# Patient Record
Sex: Male | Born: 1985 | Race: White | Hispanic: No | Marital: Married | Smoking: Never smoker
Health system: Southern US, Community
[De-identification: ages and names within clinical notes are randomized; demographics above are authoritative.]

## PROBLEM LIST (undated history)

## (undated) DIAGNOSIS — C801 Malignant (primary) neoplasm, unspecified: Secondary | ICD-10-CM

---

## 2016-02-03 DIAGNOSIS — C801 Malignant (primary) neoplasm, unspecified: Secondary | ICD-10-CM

## 2016-02-03 HISTORY — DX: Malignant (primary) neoplasm, unspecified: C80.1

## 2017-02-02 ENCOUNTER — Other Ambulatory Visit (HOSPITAL_COMMUNITY): Payer: Self-pay | Admitting: Oculoplastics Ophthalmology

## 2017-02-02 DIAGNOSIS — H469 Unspecified optic neuritis: Secondary | ICD-10-CM

## 2017-02-06 ENCOUNTER — Ambulatory Visit (HOSPITAL_COMMUNITY)
Admission: RE | Admit: 2017-02-06 | Discharge: 2017-02-06 | Disposition: A | Discharge status: Home / Self Care | Payer: BLUE CROSS/BLUE SHIELD | Source: Ambulatory Visit | Attending: Oculoplastics Ophthalmology | Admitting: Oculoplastics Ophthalmology

## 2017-02-06 DIAGNOSIS — H469 Unspecified optic neuritis: Secondary | ICD-10-CM | POA: Diagnosis not present

## 2017-02-06 MED ORDER — GADOBENATE DIMEGLUMINE 529 MG/ML IV SOLN
20.0000 mL | Freq: Once | INTRAVENOUS | Status: AC | PRN
  Administered 2017-02-06: 20 mL via INTRAVENOUS

## 2017-02-23 ENCOUNTER — Encounter (HOSPITAL_COMMUNITY): Payer: Self-pay

## 2017-02-23 ENCOUNTER — Emergency Department (HOSPITAL_COMMUNITY)
Admission: EM | Admit: 2017-02-23 | Discharge: 2017-02-23 | Disposition: A | Discharge status: Home / Self Care | Payer: BLUE CROSS/BLUE SHIELD | Attending: Emergency Medicine | Admitting: Emergency Medicine

## 2017-02-23 ENCOUNTER — Emergency Department (HOSPITAL_COMMUNITY): Payer: BLUE CROSS/BLUE SHIELD

## 2017-02-23 DIAGNOSIS — Z79899 Other long term (current) drug therapy: Secondary | ICD-10-CM | POA: Insufficient documentation

## 2017-02-23 DIAGNOSIS — H5789 Other specified disorders of eye and adnexa: Secondary | ICD-10-CM | POA: Diagnosis not present

## 2017-02-23 DIAGNOSIS — Z85828 Personal history of other malignant neoplasm of skin: Secondary | ICD-10-CM | POA: Insufficient documentation

## 2017-02-23 HISTORY — DX: Malignant (primary) neoplasm, unspecified: C80.1

## 2017-02-23 LAB — CBC WITH DIFFERENTIAL/PLATELET
Basophils Absolute: 0 10*3/uL (ref 0.0–0.1)
Basophils Relative: 0 %
EOS ABS: 0.1 10*3/uL (ref 0.0–0.7)
EOS PCT: 1 %
HCT: 47.7 % (ref 39.0–52.0)
Hemoglobin: 16.7 g/dL (ref 13.0–17.0)
LYMPHS ABS: 1.8 10*3/uL (ref 0.7–4.0)
LYMPHS PCT: 25 %
MCH: 31.9 pg (ref 26.0–34.0)
MCHC: 35 g/dL (ref 30.0–36.0)
MCV: 91 fL (ref 78.0–100.0)
MONO ABS: 0.7 10*3/uL (ref 0.1–1.0)
MONOS PCT: 9 %
Neutro Abs: 4.8 10*3/uL (ref 1.7–7.7)
Neutrophils Relative %: 65 %
PLATELETS: 268 10*3/uL (ref 150–400)
RBC: 5.24 MIL/uL (ref 4.22–5.81)
RDW: 13 % (ref 11.5–15.5)
WBC: 7.4 10*3/uL (ref 4.0–10.5)

## 2017-02-23 LAB — C-REACTIVE PROTEIN: CRP: <0.8 mg/dL (ref <1.0)

## 2017-02-23 LAB — I-STAT CHEM 8, ED
BUN: 18 mg/dL (ref 6–20)
CALCIUM ION: 1.22 mmol/L (ref 1.15–1.40)
CHLORIDE: 100 mmol/L — AB (ref 101–111)
Creatinine, Ser: 1 mg/dL (ref 0.61–1.24)
GLUCOSE: 99 mg/dL (ref 65–99)
HCT: 51 % (ref 39.0–52.0)
Hemoglobin: 17.3 g/dL — ABNORMAL HIGH (ref 13.0–17.0)
Potassium: 4.1 mmol/L (ref 3.5–5.1)
SODIUM: 139 mmol/L (ref 135–145)
TCO2: 28 mmol/L (ref 22–32)

## 2017-02-23 LAB — SEDIMENTATION RATE: Sed Rate: 0 mm/hr (ref 0–16)

## 2017-02-23 LAB — TSH: TSH: 1.403 u[IU]/mL (ref 0.350–4.500)

## 2017-02-23 MED ORDER — IOPAMIDOL (ISOVUE-300) INJECTION 61%
INTRAVENOUS | Status: AC
  Administered 2017-02-23: 75 mL
  Filled 2017-02-23: qty 75

## 2017-02-23 MED ORDER — PREDNISONE 20 MG PO TABS: 60.0000 mg | ORAL_TABLET | Freq: Every day | ORAL | 0 refills | Status: AC

## 2017-02-23 NOTE — Discharge Instructions (Signed)
It was my pleasure taking care of you today!   Please keep your appointment with Dr. Manuella Ghazi next week.   Take prednisone daily as directed.   Return to ER for new or worsening symptoms, any additional concerns.

## 2017-02-23 NOTE — ED Triage Notes (Signed)
Pt sent here by Dr. Manuella Ghazi, opthamology, for CT scan of eye. He woke up this morning with eye swollen shut. He has had eye problem in the left eye X2 weeks. Pt states he has double vision that began today.

## 2017-02-23 NOTE — ED Provider Notes (Signed)
[facility] EMERGENCY DEPARTMENT Provider Note   CSN: [id number] Arrival date & time: 02/23/17  1017     History   Chief Complaint Chief Complaint  Patient presents with  . Eye Problem    HPI Preston Grant is a 31 y.o. male.  The history is provided by the patient and medical records. No language interpreter was used.  Eye Problem   Pertinent negatives include no discharge, no photophobia and no eye redness.   Preston Grant is a 31 y.o. male  who presents to the Emergency Department complaining of swelling around his left eye since this morning. Patient states when he awoke this morning, he felt as if his eye was swollen shut. He endorses double vision when he looks right or left, but no double vision if not moving the eyes. He states that he had similar symptoms 2-3 weeks ago but at that time also had a dull pain behind his left eye. He was seen by an optometrist and ophthalmologist who did not know what the cause of his symptoms were. He had an MRI performed a few days later, at the time, symptoms had completely resolved. He went to see the ophthalmologist today who recommended that he come to the emergency department for further workup given he is symptomatic today. Ophthalmology, Dr. Manuella Ghazi, recommending CT orbits with contrast to assess for cellulitis. Per patient, he was also told his thyroid could cause this and to get thyroid checked. IOP was checked at the ophthalmologist today right eye 13, left 16. No fever, chills, cough, congestion, drainage from the eye, eye redness.   Past Medical History:  Diagnosis Date  . Cancer ([place]) 02/03/2016   Skin cancer melanoma    There are no active problems to display for this patient.   History reviewed. No pertinent surgical history.     Home Medications    Prior to Admission medications   Medication Sig Start Date End Date Taking? Authorizing Provider  loratadine (CLARITIN) 10 MG tablet Take 10 mg by  mouth daily.   Yes [provider]  ranitidine (ZANTAC) 150 MG tablet Take 150 mg by mouth daily.   Yes [provider]  predniSONE (DELTASONE) 20 MG tablet Take 3 tablets (60 mg total) by mouth daily. 02/23/17   Ward, Ozella Almond, PA-C    Family History History reviewed. No pertinent family history.  Social History Social History  Substance Use Topics  . Smoking status: Never Smoker  . Smokeless tobacco: Never Used  . Alcohol use 0.6 oz/week    1 Cans of beer per week     Allergies   Patient has no known allergies.   Review of Systems Review of Systems  Constitutional: Negative for chills and fever.  Eyes: Positive for visual disturbance (Blurry vision). Negative for photophobia, discharge and redness.       + swelling around left eye  All other systems reviewed and are negative.    Physical Exam Updated Vital Signs BP (!) 149/102 (BP Location: Right Arm)   Pulse 72   Temp 98.1 F (36.7 C) (Oral)   Resp 16   SpO2 99%   Physical Exam  Constitutional: He is oriented to person, place, and time. He appears well-developed and well-nourished. No distress.  HENT:  Head: Normocephalic and atraumatic.  Mouth/Throat: Oropharynx is clear and moist.  Eyes: Pupils are equal, round, and reactive to light. EOM are normal.  Mild periorbital swelling. No warmth. No tenderness to palpation. No consensual photophobia.  Cardiovascular: Normal rate, regular rhythm and normal heart sounds.   No murmur heard. Pulmonary/Chest: Effort normal and breath sounds normal. No respiratory distress.  Abdominal: Soft. He exhibits no distension. There is no tenderness.  Musculoskeletal: Normal range of motion.  Neurological: He is alert and oriented to person, place, and time.  Skin: Skin is warm and dry.  Nursing note and vitals reviewed.    ED Treatments / Results  Labs (all labs ordered are listed, but only abnormal results are displayed) Labs Reviewed  I-STAT CHEM  8, ED - Abnormal; Notable for the following:       Result Value   Chloride 100 (*)    Hemoglobin 17.3 (*)    All other components within normal limits  CBC WITH DIFFERENTIAL/PLATELET  TSH  C-REACTIVE PROTEIN  SEDIMENTATION RATE  ACETYLCHOLINE RECEPTOR AB, ALL  THYROID STIMULATING IMMUNOGLOBULIN  QUANTIFERON-TB GOLD PLUS  B. BURGDORFI ANTIBODIES  THYROTROPIN RECEPTOR AUTOABS  ANTINUCLEAR ANTIBODIES, IFA  ANGIOTENSIN CONVERTING ENZYME  RHEUMATOID FACTOR    EKG  EKG Interpretation None       Radiology Ct Orbits W Contrast  Result Date: 02/23/2017 CLINICAL DATA:  Acute swelling of the left eye.  Double vision. EXAM: CT ORBITS WITH CONTRAST TECHNIQUE: Multidetector CT images was performed according to the standard protocol following intravenous contrast administration. CONTRAST:  75 cc Isovue-300 COMPARISON:  CT and MRI 02/06/2017 FINDINGS: Orbits: Both globes appear normal. Both optic nerves are normal. Extra-ocular muscles are normal. Lacrimal glands are normal. Orbital fat is normal. Orbital apices are normal. Vascular structures appear normal. No bone abnormality. Visualized sinuses: Clear and normal Soft tissues: Nonspecific mild periorbital superficial soft tissue edema left more than right. Limited intracranial: Normal IMPRESSION: Nonspecific mild periorbital soft tissue edema left more than right. No evidence of intrinsic orbital pathology. Electronically Signed   By: Nelson Chimes M.D.   On: 02/23/2017 12:56    Procedures Procedures (including critical care time)  Medications Ordered in ED Medications  iopamidol (ISOVUE-300) 61 % injection (75 mLs  Contrast Given 02/23/17 1202)     Initial Impression / Assessment and Plan / ED Course  I have reviewed the triage vital signs and the nursing notes.  Pertinent labs & imaging results that were available during my care of the patient were reviewed by me and considered in my medical decision making (see chart for  details).    Preston Grant is a 31 y.o. male who presents to ED from ophthalmologist for left periorbital swelling. Patient is afebrile, hemodynamically stable with normal visual acuity. IOP checked at ophthalmologist early this morning and wdl. EOMI, PERRL. No consensual photophobia. CT orbits obtained showing nonspecific mild periorbital soft tissue edema L>R. TSH wdl. CBC wdl. Discussed case with Dr. Manuella Ghazi of ophthalmology who sent patient to the ED today. He recommends obtaining several labs which he will follow up on at patient's scheduled appointment next week. He also recommends starting patient on 60mg  prednisone x 1 week. Lab work ordered here in ED to expedite outpatient work up. Patient aware of outpatient follow up appointment and understands that Dr. Manuella Ghazi will follow up on pending lab work. Reasons to return to ER discussed and all questions answered.      Final Clinical Impressions(s) / ED Diagnoses   Final diagnoses:  Periorbital swelling    New Prescriptions New Prescriptions   PREDNISONE (DELTASONE) 20 MG TABLET    Take 3 tablets (60 mg total) by mouth daily.     Ward, Ozella Almond, PA-C  02/23/17 [place], MD 02/24/17 [phone]

## 2017-02-24 LAB — ANGIOTENSIN CONVERTING ENZYME: ANGIOTENSIN-CONVERTING ENZYME: 70 U/L (ref 14–82)

## 2017-02-24 LAB — THYROID STIMULATING IMMUNOGLOBULIN

## 2017-02-24 LAB — ANTINUCLEAR ANTIBODIES, IFA: ANA Ab, IFA: NEGATIVE

## 2017-02-24 LAB — B. BURGDORFI ANTIBODIES

## 2017-02-24 LAB — RHEUMATOID FACTOR: Rhuematoid fact SerPl-aCnc: <10 IU/mL (ref 0.0–13.9)

## 2017-02-24 LAB — THYROTROPIN RECEPTOR AUTOABS: Thyrotropin Receptor Ab: <0.5 IU/L (ref 0.00–1.75)

## 2017-02-27 LAB — ACETYLCHOLINE RECEPTOR AB, ALL
ACETYLCHOL BLOCK AB: 13 % (ref 0–25)
Acety choline binding ab: <0.03 nmol/L (ref 0.00–0.24)
Acetylcholine Modulat Ab: <12 % (ref 0–20)

## 2017-03-03 LAB — QUANTIFERON-TB GOLD PLUS (RQFGPL)
QUANTIFERON NIL VALUE: 0.05 [IU]/mL
QUANTIFERON TB1 AG VALUE: 0.02 [IU]/mL
QUANTIFERON TB2 AG VALUE: 0.02 [IU]/mL
QuantiFERON Mitogen Value: >10 IU/mL

## 2017-03-03 LAB — QUANTIFERON-TB GOLD PLUS: QUANTIFERON-TB GOLD PLUS: NEGATIVE

## 2019-04-16 IMAGING — CT CT ORBITS W/ CM
3 of 4 series · 14 of 47 positions shown, 16 images · IV contrast (Omni 300)
Comparison: CT and MRI 02/06/2017

CLINICAL DATA: Acute swelling of the left eye.  Double vision.

EXAM:
CT ORBITS WITH CONTRAST
TECHNIQUE: Multidetector CT images was performed according to the standard
protocol following intravenous contrast administration.
CONTRAST:  75 cc [id number]

[Series 4: facialbone 2.0 st · axial · 0.40mm/px · z∈[-122,-44]mm · 8 of 47 slices shown, 10 images]
[im 4/47  brain]
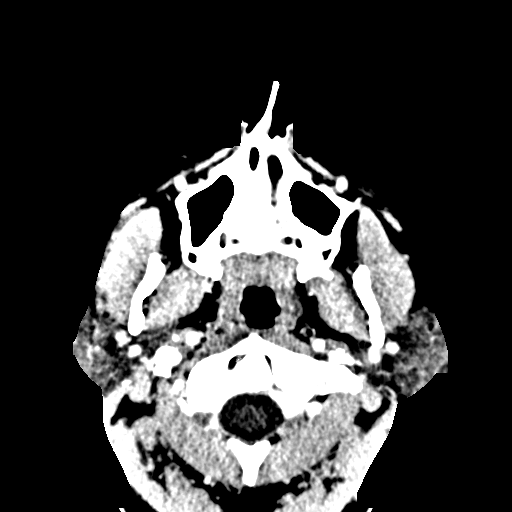
[im 4/47  bone]
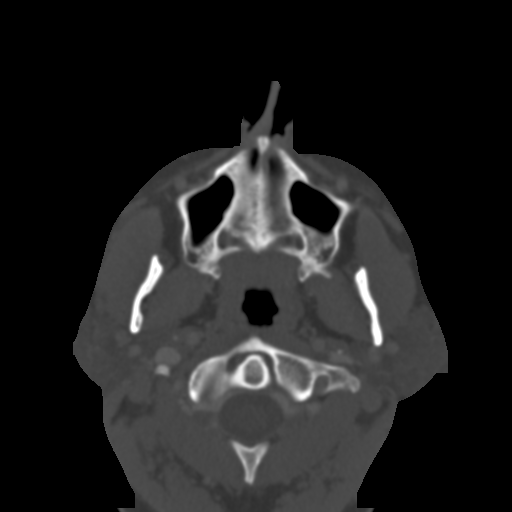
[im 10/47  bone]
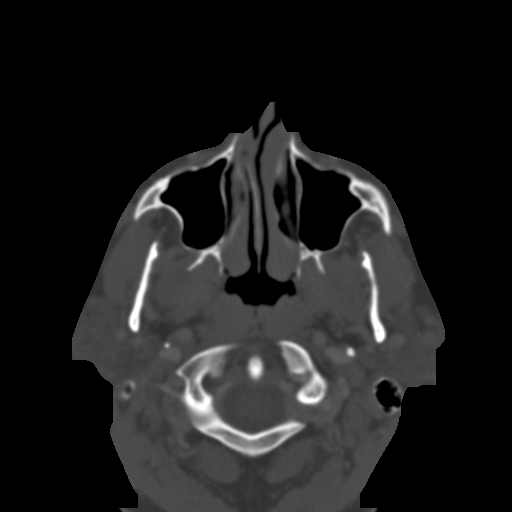
[im 17/47  bone]
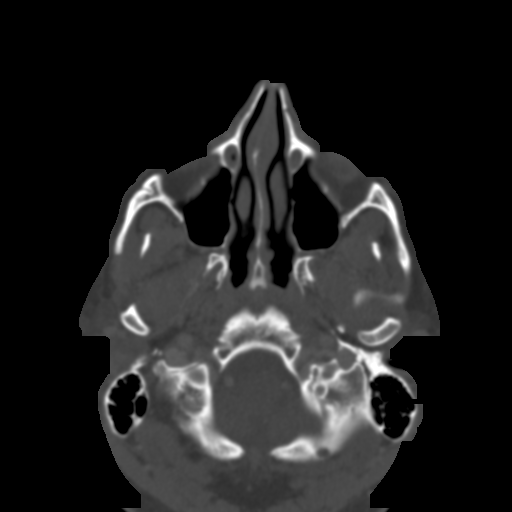
[im 20/47  bone]
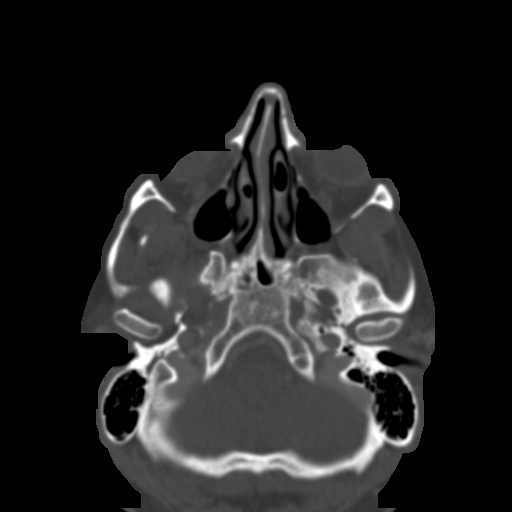
[im 27/47  brain]
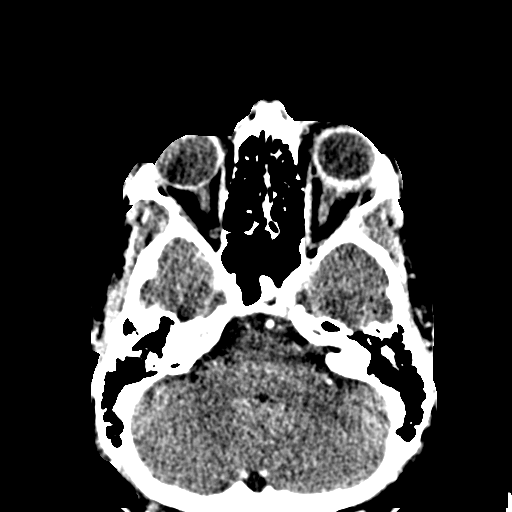
[im 27/47  bone]
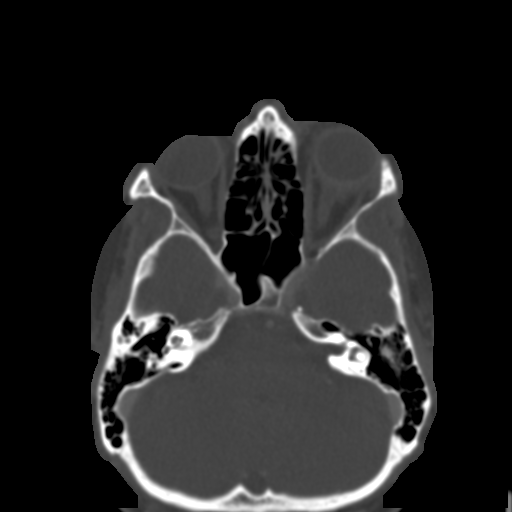
[im 30/47  bone]
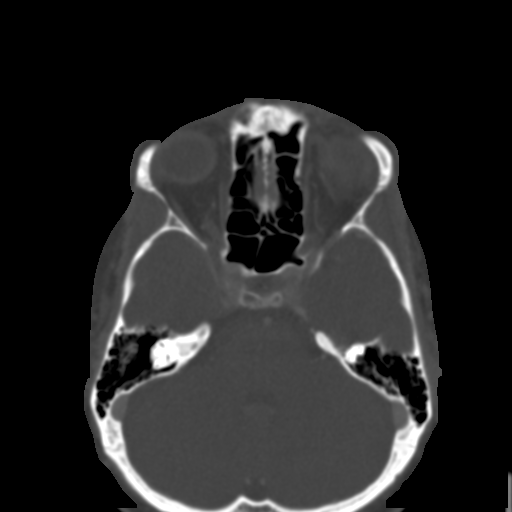
[im 37/47  bone]
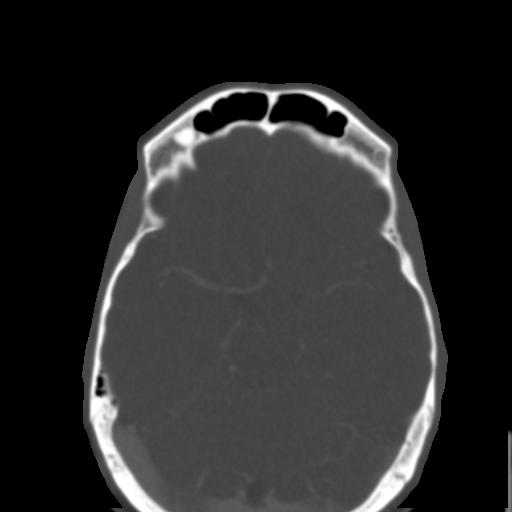
[im 43/47  bone]
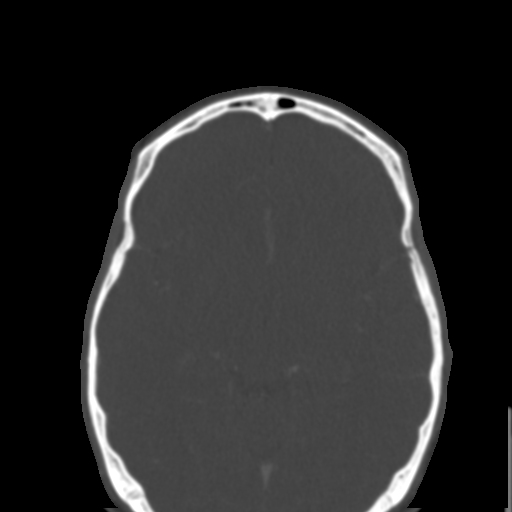

[Series 7: facialbone 2.0 cor st · coronal · 0.22mm/px · 3 of 75 slices shown]
[im 25/75  bone]
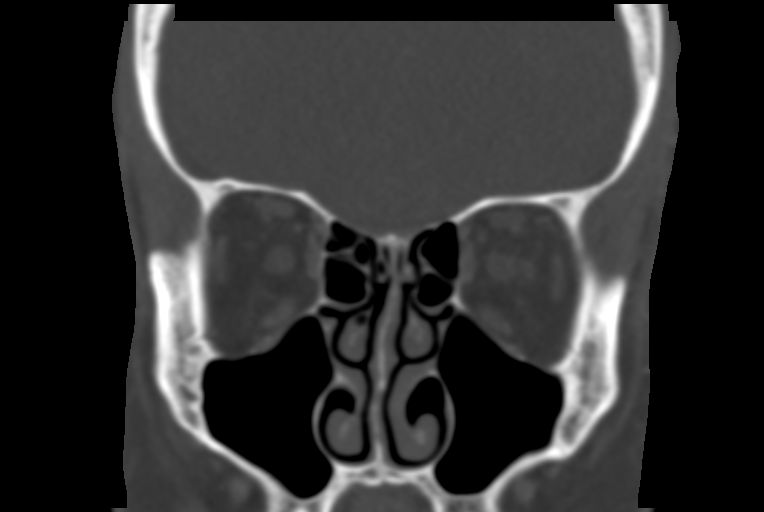
[im 33/75  bone]
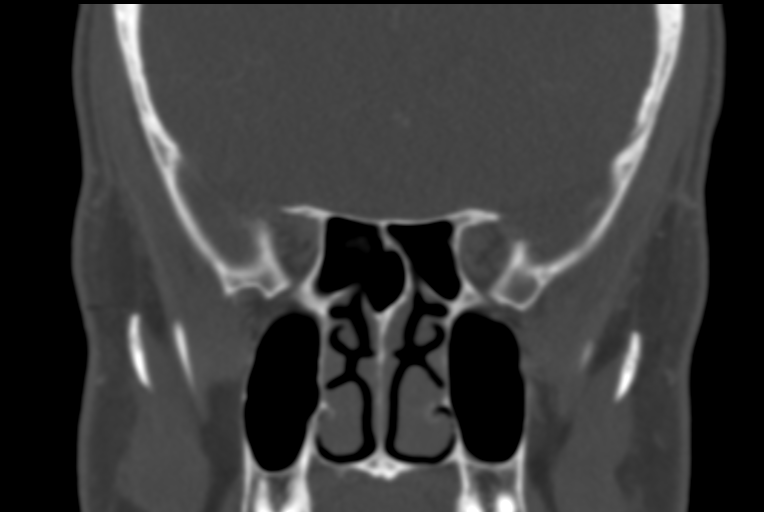
[im 42/75  bone]
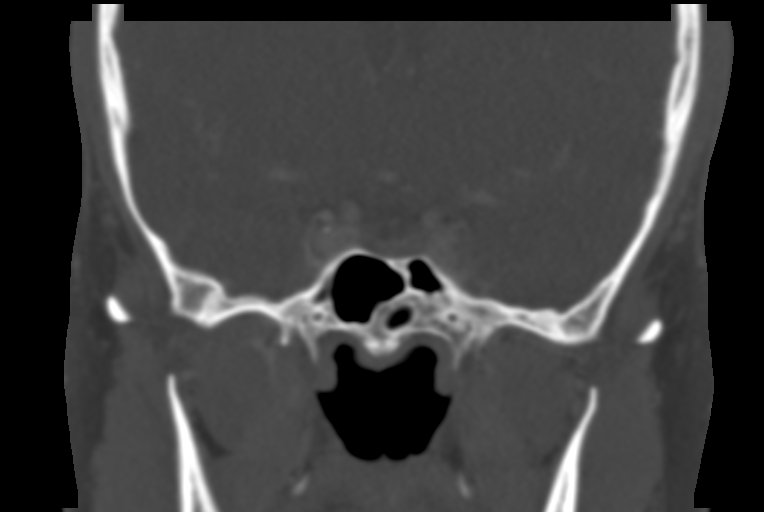

[Series 9: facialbone 2.0 sag st · sagittal · 0.22mm/px · 3 of 79 slices shown]
[im 27/79  bone]
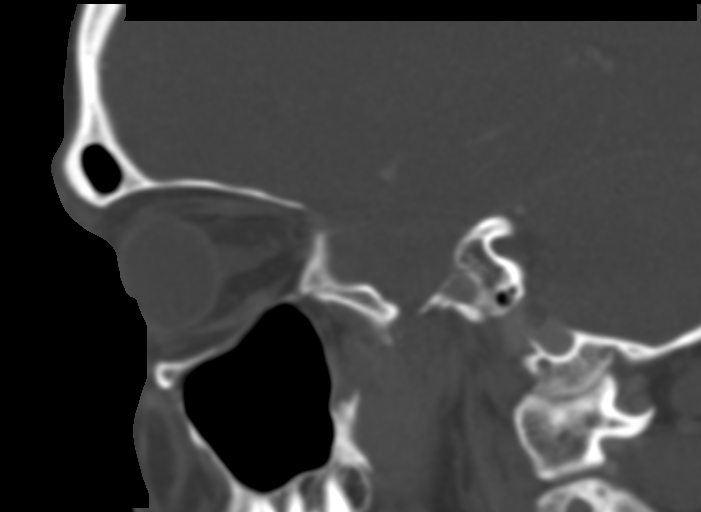
[im 40/79  bone]
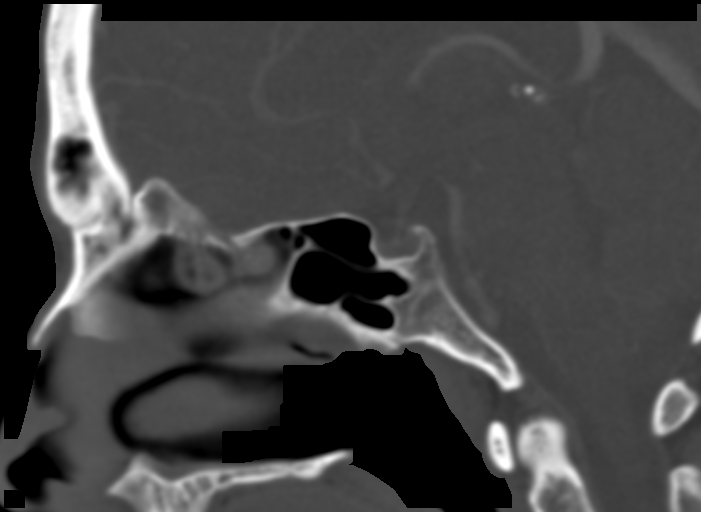
[im 53/79  bone]
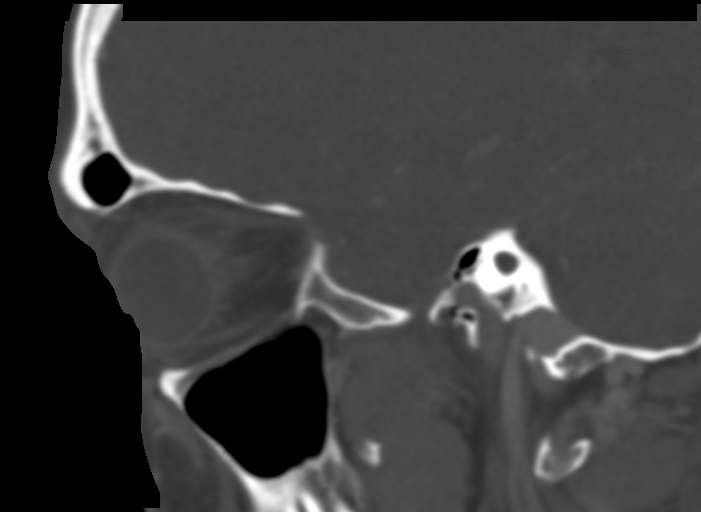

[14 of 47 positions shown; findings below may reference images not displayed]

FINDINGS: Orbits: Both globes appear normal. Both optic nerves are normal.
Extra-ocular muscles are normal. Lacrimal glands are normal. Orbital
fat is normal. Orbital apices are normal. Vascular structures appear
normal. No bone abnormality.

Visualized sinuses: Clear and normal

Soft tissues: Nonspecific mild periorbital superficial soft tissue
edema left more than right.

Limited intracranial: Normal
IMPRESSION: Nonspecific mild periorbital soft tissue edema left more than right.
No evidence of intrinsic orbital pathology.
